# Patient Record
Sex: Female | Born: 1970 | Race: White | Hispanic: No | Smoking: Never smoker
Health system: Southern US, Community
[De-identification: ages and names within clinical notes are randomized; demographics above are authoritative.]

## PROBLEM LIST (undated history)

## (undated) DIAGNOSIS — F429 Obsessive-compulsive disorder, unspecified: Secondary | ICD-10-CM

## (undated) DIAGNOSIS — F419 Anxiety disorder, unspecified: Secondary | ICD-10-CM

## (undated) HISTORY — PX: APPENDECTOMY: SHX54

---

## 2010-06-25 ENCOUNTER — Emergency Department (INDEPENDENT_AMBULATORY_CARE_PROVIDER_SITE_OTHER): Payer: Managed Care, Other (non HMO)

## 2010-06-25 ENCOUNTER — Emergency Department (HOSPITAL_BASED_OUTPATIENT_CLINIC_OR_DEPARTMENT_OTHER)
Admission: EM | Admit: 2010-06-25 | Discharge: 2010-06-25 | Disposition: A | Discharge status: Home / Self Care | Payer: Managed Care, Other (non HMO) | Attending: Emergency Medicine | Admitting: Emergency Medicine

## 2010-06-25 DIAGNOSIS — S62319A Displaced fracture of base of unspecified metacarpal bone, initial encounter for closed fracture: Secondary | ICD-10-CM

## 2010-06-25 DIAGNOSIS — W108XXA Fall (on) (from) other stairs and steps, initial encounter: Secondary | ICD-10-CM | POA: Insufficient documentation

## 2010-06-25 DIAGNOSIS — Y92009 Unspecified place in unspecified non-institutional (private) residence as the place of occurrence of the external cause: Secondary | ICD-10-CM | POA: Insufficient documentation

## 2012-03-16 IMAGING — CR DG WRIST COMPLETE 3+V*L*
4 series · 4 of 4 positions shown · non-contrast
Comparison: None

CLINICAL DATA: Fall with left wrist pain.

LEFT WRIST - COMPLETE 3+ VIEW

[x wrist pa left]
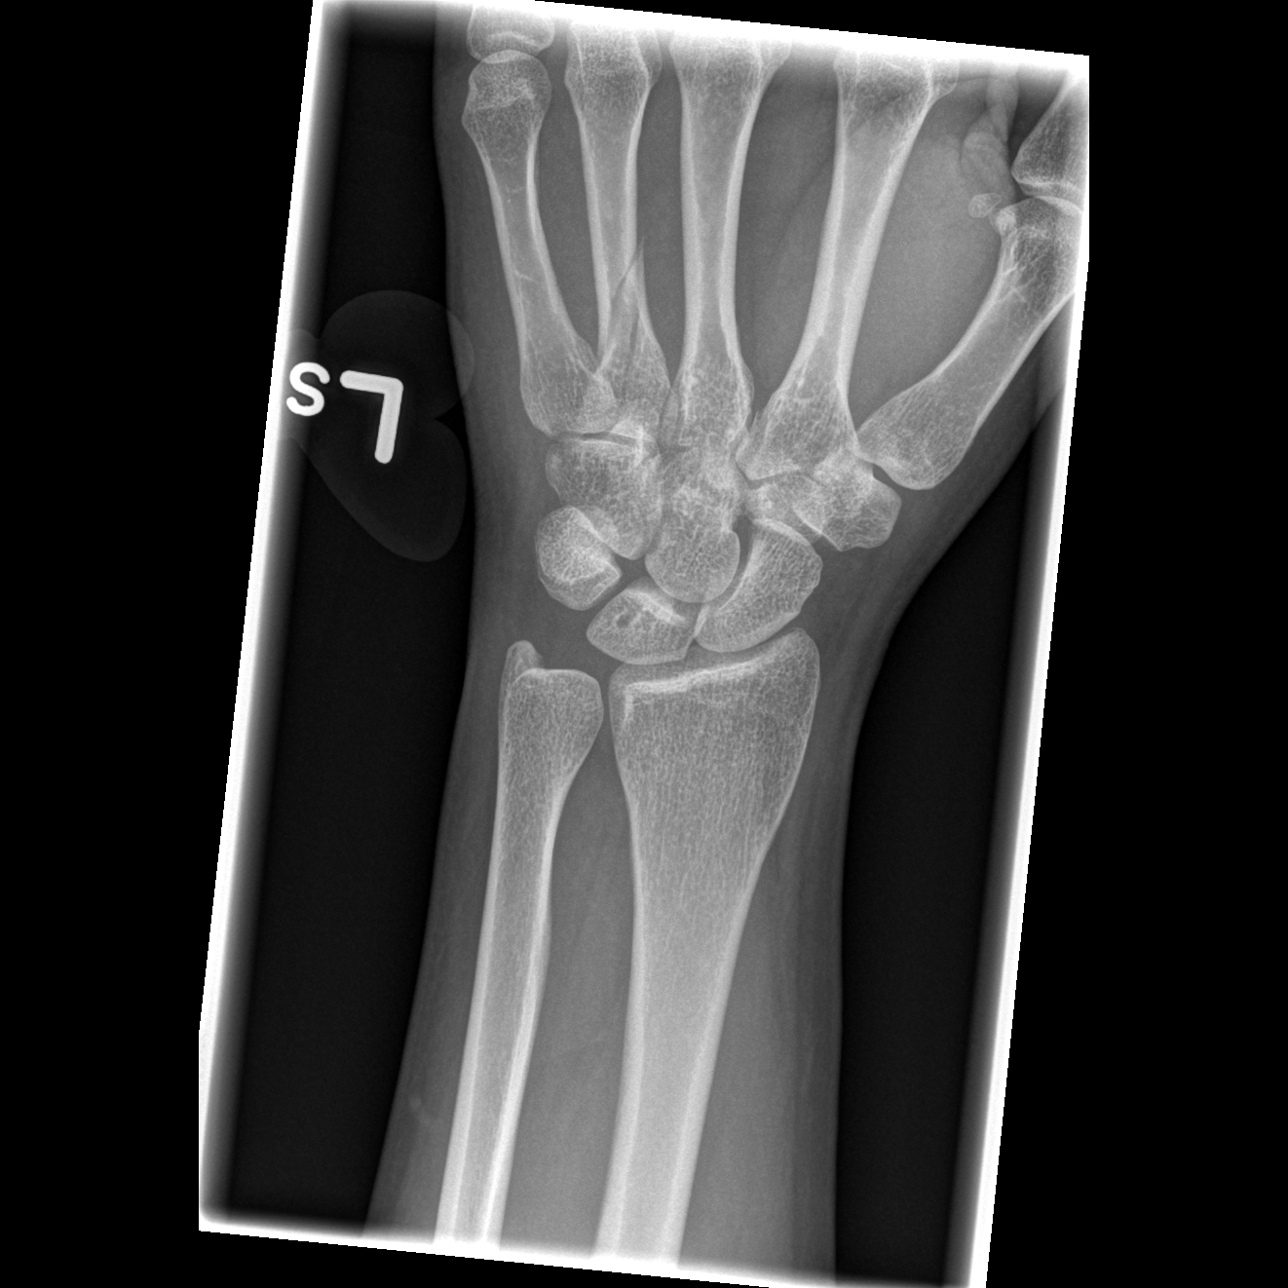

[x wrist obl left]
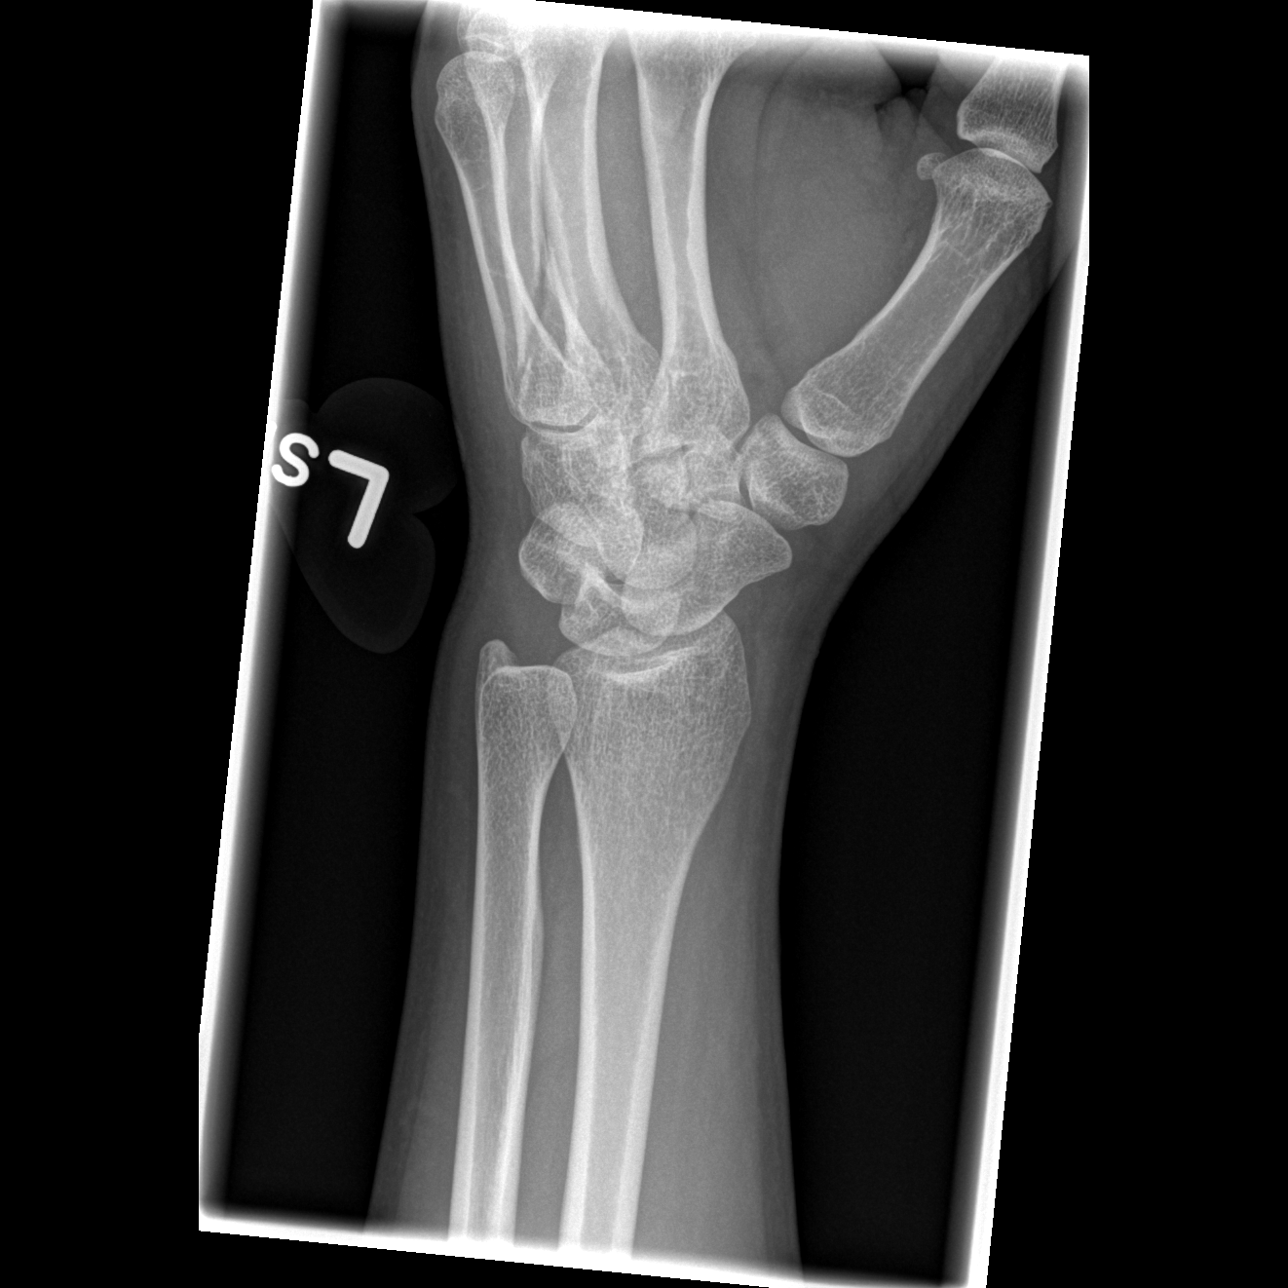

[x wrist lat left]
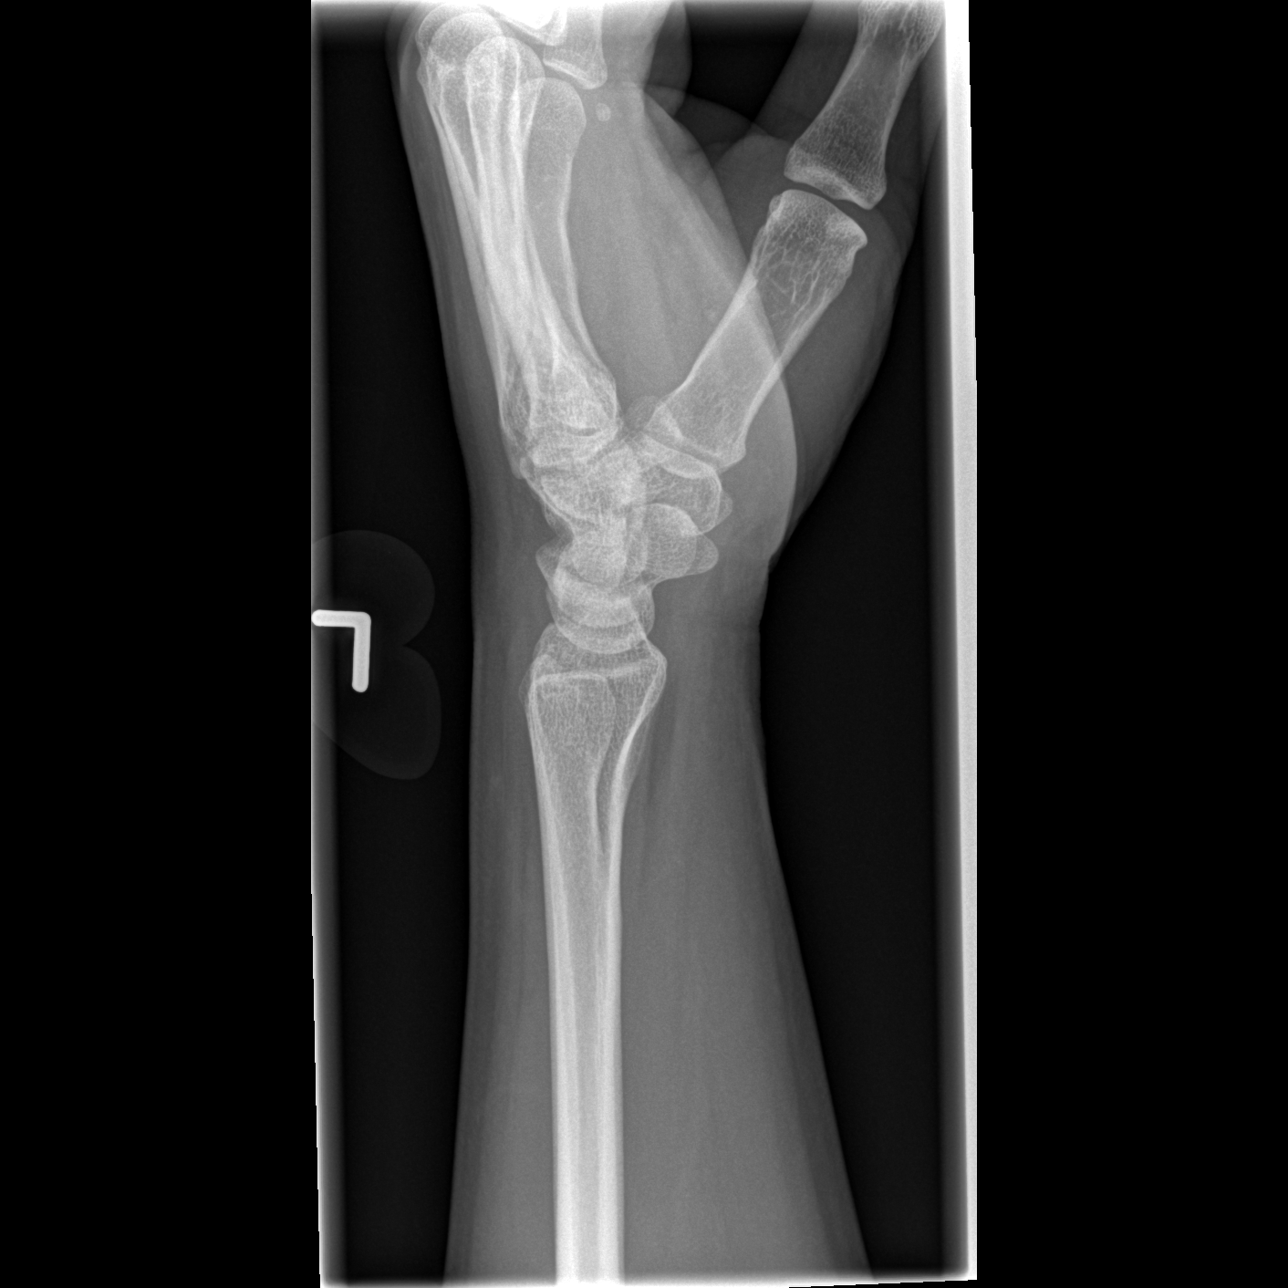

[x navicular]
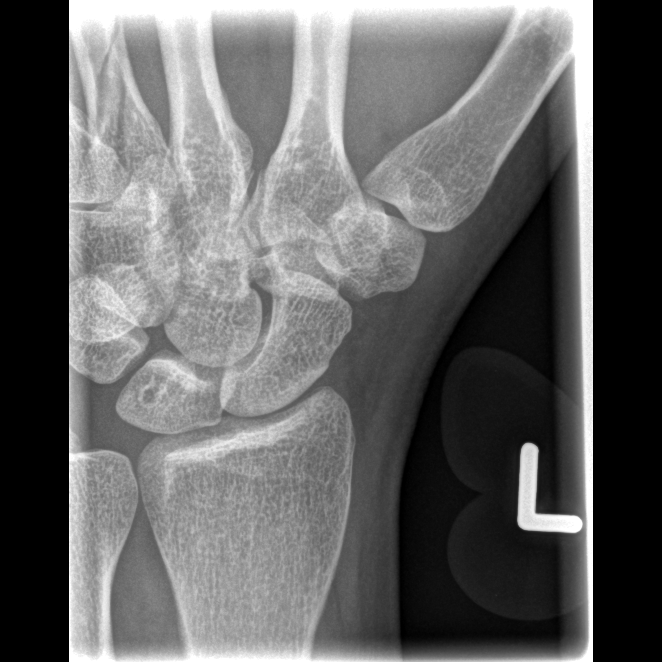

[4 of 4 positions shown; findings below may reference images not displayed]

FINDINGS: An oblique fracture of the proximal fourth metacarpal is
identified and extends into the carpometacarpal joint.
There is no evidence of subluxation or dislocation.
No other fracture identified.
IMPRESSION: Intra-articular oblique fracture of the proximal fourth metacarpal.

## 2017-01-09 ENCOUNTER — Emergency Department (HOSPITAL_BASED_OUTPATIENT_CLINIC_OR_DEPARTMENT_OTHER): Payer: Medicaid Other

## 2017-01-09 ENCOUNTER — Emergency Department (HOSPITAL_BASED_OUTPATIENT_CLINIC_OR_DEPARTMENT_OTHER)
Admission: EM | Admit: 2017-01-09 | Discharge: 2017-01-09 | Disposition: A | Discharge status: Home / Self Care | Payer: Medicaid Other | Attending: Emergency Medicine | Admitting: Emergency Medicine

## 2017-01-09 ENCOUNTER — Encounter (HOSPITAL_BASED_OUTPATIENT_CLINIC_OR_DEPARTMENT_OTHER): Payer: Self-pay | Admitting: Emergency Medicine

## 2017-01-09 ENCOUNTER — Other Ambulatory Visit: Payer: Self-pay

## 2017-01-09 DIAGNOSIS — S61215A Laceration without foreign body of left ring finger without damage to nail, initial encounter: Secondary | ICD-10-CM | POA: Diagnosis not present

## 2017-01-09 DIAGNOSIS — Y93G1 Activity, food preparation and clean up: Secondary | ICD-10-CM | POA: Insufficient documentation

## 2017-01-09 DIAGNOSIS — Z23 Encounter for immunization: Secondary | ICD-10-CM | POA: Insufficient documentation

## 2017-01-09 DIAGNOSIS — W260XXA Contact with knife, initial encounter: Secondary | ICD-10-CM | POA: Diagnosis not present

## 2017-01-09 DIAGNOSIS — Y929 Unspecified place or not applicable: Secondary | ICD-10-CM | POA: Insufficient documentation

## 2017-01-09 DIAGNOSIS — Y999 Unspecified external cause status: Secondary | ICD-10-CM | POA: Insufficient documentation

## 2017-01-09 DIAGNOSIS — S6992XA Unspecified injury of left wrist, hand and finger(s), initial encounter: Secondary | ICD-10-CM | POA: Diagnosis present

## 2017-01-09 HISTORY — DX: Anxiety disorder, unspecified: F41.9

## 2017-01-09 HISTORY — DX: Obsessive-compulsive disorder, unspecified: F42.9

## 2017-01-09 MED ORDER — LIDOCAINE HCL (PF) 1 % IJ SOLN
30.0000 mL | Freq: Once | INTRAMUSCULAR | Status: AC
  Administered 2017-01-09: 30 mL

## 2017-01-09 MED ORDER — CEPHALEXIN 500 MG PO CAPS: 500.0000 mg | ORAL_CAPSULE | Freq: Two times a day (BID) | ORAL | 0 refills | Status: AC

## 2017-01-09 MED ORDER — TETANUS-DIPHTH-ACELL PERTUSSIS 5-2.5-18.5 LF-MCG/0.5 IM SUSP
0.5000 mL | Freq: Once | INTRAMUSCULAR | Status: AC
  Administered 2017-01-09: 0.5 mL via INTRAMUSCULAR
  Filled 2017-01-09: qty 0.5

## 2017-01-09 NOTE — ED Triage Notes (Signed)
Pt reports using a [place] slicer when she cut her ring finger on her left hand. [facility]. She reports bleeding since 1400. At triage finger continues to bleed when undressed. Pressure dressing applied.

## 2017-01-09 NOTE — Discharge Instructions (Signed)
You were seen in the ED today with finger laceration. We repaired it with stitches that will need to be removed in 5-7 days. Take the antibiotics as directed.

## 2017-01-09 NOTE — ED Provider Notes (Signed)
Emergency Department Provider Note   I have reviewed the triage vital signs and the nursing notes.   HISTORY  Chief Complaint Laceration and Finger Injury   HPI Melissa Henson is a 46 y.o. female's to the emergency department for evaluation after sustaining a laceration to the left ring finger.  She is right-hand dominant.  The patient was using a [place] slicer when she inadvertently cut the medial side of her left ring finger.  No numbness or tingling in the area.  She had significant bleeding which she could not get to stop and so presented to the emergency department.  Past Medical History:  Diagnosis Date  . Anxiety   . OCD (obsessive compulsive disorder)     There are no active problems to display for this patient.   Past Surgical History:  Procedure Laterality Date  . APPENDECTOMY      Current Outpatient Rx  . Order #: [id number] Class: Historical Med  . Order #: [id number] Class: Print  . Order #: [id number] Class: Historical Med    Allergies Doxycycline  History reviewed. No pertinent family history.  Social History Social History   Tobacco Use  . Smoking status: Never Smoker  . Smokeless tobacco: Never Used  Substance Use Topics  . Alcohol use: Yes  . Drug use: No    Review of Systems  Constitutional: No fever/chills Eyes: No visual changes. ENT: No sore throat. Cardiovascular: Denies chest pain. Respiratory: Denies shortness of breath. Gastrointestinal: No abdominal pain.  No nausea, no vomiting.  No diarrhea.  No constipation. Genitourinary: Negative for dysuria. Musculoskeletal: Negative for back pain. Skin: Left ring finger laceration.  Neurological: Negative for headaches, focal weakness or numbness.  10-point ROS otherwise negative.  ____________________________________________   PHYSICAL EXAM:  VITAL SIGNS: ED Triage Vitals  Enc Vitals Group     BP 01/09/17 1852 123/87     Pulse Rate 01/09/17 1852 (!) 118     Resp 01/09/17 1852  18     Temp 01/09/17 1852 98.3 F (36.8 C)     Temp Source 01/09/17 1852 Oral     SpO2 01/09/17 1852 99 %     Weight 01/09/17 1825 165 lb (74.8 kg)     Height 01/09/17 1825 5\' 9"  (1.753 m)     Pain Score 01/09/17 1825 1   Constitutional: Alert and oriented. Well appearing and in no acute distress. Eyes: Conjunctivae are normal.  Head: Atraumatic. Nose: No congestion/rhinnorhea. Mouth/Throat: Mucous membranes are moist.  Neck: No stridor.  Cardiovascular: Good peripheral circulation. Respiratory: Normal respiratory effort.  Gastrointestinal: Soft and nontender. No distention.  Musculoskeletal: No lower extremity tenderness nor edema. No gross deformities of extremities. Neurologic:  Normal speech and language. No gross focal neurologic deficits are appreciated.  Skin:  Skin is warm, dry and intact. "C" shaped skin flap laceration over the lateral aspect of the left ring finger. 4 cm total length. Normal capillary refill. Numbness over the skin flap but otherwise normal sensation.  ____________________________________________  RADIOLOGY  Dg Hand Complete Left  Result Date: 01/09/2017 CLINICAL DATA:  Injury to the left ring finger. EXAM: LEFT HAND - COMPLETE 3+ VIEW COMPARISON:  06/25/2010 FINDINGS: There is no evidence of fracture or dislocation. There is no evidence of arthropathy or other focal bone abnormality. Soft tissue injury to the tip of the fourth left digit. IMPRESSION: No acute fracture or dislocation identified about the fourth left digit. Soft tissue injury. Electronically Signed   By: Ted Mcalpineobrinka  Dimitrova M.D.   On: 01/09/2017  20:55    ____________________________________________   PROCEDURES  Procedure(s) performed:   Marland KitchenMarland KitchenLaceration Repair Date/Time: 01/10/2017 12:20 PM Performed by: Maia Plan, MD Authorized by: Maia Plan, MD   Consent:    Consent obtained:  Verbal   Consent given by:  Patient   Risks discussed:  Infection, pain, retained foreign  body, tendon damage, vascular damage, poor wound healing, poor cosmetic result, need for additional repair and nerve damage   Alternatives discussed:  No treatment Universal protocol:    Procedure explained and questions answered to patient or proxy's satisfaction: yes     Patient identity confirmed:  Verbally with patient Anesthesia (see MAR for exact dosages):    Anesthesia method:  Nerve block   Block location:  Digital   Block needle gauge:  25 G   Block anesthetic:  Lidocaine 1% w/o epi   Block technique:  Digital block   Block injection procedure:  Anatomic landmarks identified, anatomic landmarks palpated, negative aspiration for blood, introduced needle and incremental injection   Block outcome:  Anesthesia achieved Laceration details:    Location:  Finger   Finger location:  L ring finger   Length (cm):  4 Repair type:    Repair type:  Intermediate Pre-procedure details:    Preparation:  Patient was prepped and draped in usual sterile fashion Exploration:    Hemostasis achieved with:  Direct pressure   Wound exploration: wound explored through full range of motion and entire depth of wound probed and visualized     Wound extent: no foreign bodies/material noted, no tendon damage noted and no underlying fracture noted     Contaminated: no   Treatment:    Area cleansed with:  Betadine and saline   Amount of cleaning:  Extensive   Irrigation method:  Pressure wash   Visualized foreign bodies/material removed: no   Skin repair:    Repair method:  Sutures   Suture size:  6-0   Suture material:  Prolene   Suture technique:  Simple interrupted   Number of sutures:  7 Approximation:    Approximation:  Close   Vermilion border: well-aligned   Post-procedure details:    Dressing:  Antibiotic ointment and bulky dressing   Patient tolerance of procedure:  Tolerated well, no immediate complications    ____________________________________________   INITIAL IMPRESSION /  ASSESSMENT AND PLAN / ED COURSE  Pertinent labs & imaging results that were available during my care of the patient were reviewed by me and considered in my medical decision making (see chart for details).  Left ring finger laceration.  No underlying fracture.  The laceration was repaired with 7 6-0 prolene suture. Will start Keflex and have the patient return in 5-7 days for re-evaluation.   Plan for suture removal in 5-7 days with PCP follow up. Numbness only over the skin flap. Will have the patient return at time for suture removal for wound evaluation.   At this time, I do not feel there is any life-threatening condition present. I have reviewed and discussed all results (EKG, imaging, lab, urine as appropriate), exam findings with patient. I have reviewed nursing notes and appropriate previous records.  I feel the patient is safe to be discharged home without further emergent workup. Discussed usual and customary return precautions. Patient and family (if present) verbalize understanding and are comfortable with this plan.  Patient will follow-up with their primary care provider. If they do not have a primary care provider, information for follow-up has been  provided to them. All questions have been answered.  ____________________________________________  FINAL CLINICAL IMPRESSION(S) / ED DIAGNOSES  Final diagnoses:  Laceration of left ring finger without foreign body without damage to nail, initial encounter     MEDICATIONS GIVEN DURING THIS VISIT:  Medications  lidocaine (PF) (XYLOCAINE) 1 % injection 30 mL (30 mLs Infiltration Given by Other 01/09/17 2139)  Tdap (BOOSTRIX) injection 0.5 mL (0.5 mLs Intramuscular Given 01/09/17 2136)     NEW OUTPATIENT MEDICATIONS STARTED DURING THIS VISIT:  Keflex  Note:  This document was prepared using Dragon voice recognition software and may include unintentional dictation errors.  Alona BeneJoshua Vassie Kugel, MD Emergency Medicine    Autumn Pruitt, Arlyss RepressJoshua G,  MD 01/10/17 1224
